# Patient Record
Sex: Male | Born: 1985 | Race: White | Hispanic: No | Smoking: Never smoker
Health system: Southern US, Community
[De-identification: ages and names within clinical notes are randomized; demographics above are authoritative.]

---

## 2021-03-09 ENCOUNTER — Encounter (HOSPITAL_COMMUNITY): Payer: Self-pay | Admitting: *Deleted

## 2021-03-09 ENCOUNTER — Observation Stay (HOSPITAL_COMMUNITY)
Admission: EM | Admit: 2021-03-09 | Discharge: 2021-03-11 | Disposition: A | Discharge status: Home / Self Care | Payer: Self-pay | Attending: Emergency Medicine | Admitting: Emergency Medicine

## 2021-03-09 ENCOUNTER — Other Ambulatory Visit: Payer: Self-pay

## 2021-03-09 DIAGNOSIS — D696 Thrombocytopenia, unspecified: Secondary | ICD-10-CM | POA: Diagnosis not present

## 2021-03-09 DIAGNOSIS — R251 Tremor, unspecified: Secondary | ICD-10-CM | POA: Diagnosis not present

## 2021-03-09 DIAGNOSIS — Y900 Blood alcohol level of less than 20 mg/100 ml: Secondary | ICD-10-CM | POA: Insufficient documentation

## 2021-03-09 DIAGNOSIS — Z20822 Contact with and (suspected) exposure to covid-19: Secondary | ICD-10-CM | POA: Diagnosis not present

## 2021-03-09 DIAGNOSIS — F10131 Alcohol abuse with withdrawal delirium: Secondary | ICD-10-CM | POA: Insufficient documentation

## 2021-03-09 DIAGNOSIS — F10939 Alcohol use, unspecified with withdrawal, unspecified: Secondary | ICD-10-CM | POA: Diagnosis present

## 2021-03-09 DIAGNOSIS — S301XXA Contusion of abdominal wall, initial encounter: Secondary | ICD-10-CM | POA: Diagnosis not present

## 2021-03-09 DIAGNOSIS — E871 Hypo-osmolality and hyponatremia: Secondary | ICD-10-CM

## 2021-03-09 DIAGNOSIS — F10231 Alcohol dependence with withdrawal delirium: Secondary | ICD-10-CM

## 2021-03-09 DIAGNOSIS — S3991XA Unspecified injury of abdomen, initial encounter: Secondary | ICD-10-CM | POA: Diagnosis present

## 2021-03-09 DIAGNOSIS — E876 Hypokalemia: Secondary | ICD-10-CM

## 2021-03-09 DIAGNOSIS — F10931 Alcohol use, unspecified with withdrawal delirium: Secondary | ICD-10-CM

## 2021-03-09 DIAGNOSIS — F10239 Alcohol dependence with withdrawal, unspecified: Secondary | ICD-10-CM | POA: Diagnosis present

## 2021-03-09 LAB — CBC WITH DIFFERENTIAL/PLATELET
Abs Immature Granulocytes: 0.03 10*3/uL (ref 0.00–0.07)
Basophils Absolute: 0 10*3/uL (ref 0.0–0.1)
Basophils Relative: 0 %
Eosinophils Absolute: 0.2 10*3/uL (ref 0.0–0.5)
Eosinophils Relative: 3 %
HCT: 39.4 % (ref 39.0–52.0)
Hemoglobin: 13.1 g/dL (ref 13.0–17.0)
Immature Granulocytes: 1 %
Lymphocytes Relative: 18 %
Lymphs Abs: 1.1 10*3/uL (ref 0.7–4.0)
MCH: 32.8 pg (ref 26.0–34.0)
MCHC: 33.2 g/dL (ref 30.0–36.0)
MCV: 98.7 fL (ref 80.0–100.0)
Monocytes Absolute: 0.7 10*3/uL (ref 0.1–1.0)
Monocytes Relative: 12 %
Neutro Abs: 3.8 10*3/uL (ref 1.7–7.7)
Neutrophils Relative %: 66 %
Platelets: 16 10*3/uL — CL (ref 150–400)
RBC: 3.99 MIL/uL — ABNORMAL LOW (ref 4.22–5.81)
RDW: 12.2 % (ref 11.5–15.5)
WBC: 5.8 10*3/uL (ref 4.0–10.5)
nRBC: 0 % (ref 0.0–0.2)

## 2021-03-09 LAB — URINALYSIS, ROUTINE W REFLEX MICROSCOPIC
Bacteria, UA: NONE SEEN
Bilirubin Urine: NEGATIVE
Glucose, UA: 50 mg/dL — AB
Hgb urine dipstick: NEGATIVE
Ketones, ur: NEGATIVE mg/dL
Leukocytes,Ua: NEGATIVE
Nitrite: NEGATIVE
Protein, ur: 100 mg/dL — AB
Specific Gravity, Urine: 1.018 (ref 1.005–1.030)
pH: 9 — ABNORMAL HIGH (ref 5.0–8.0)

## 2021-03-09 LAB — PROTIME-INR
INR: 1.2 (ref 0.8–1.2)
Prothrombin Time: 15 seconds (ref 11.4–15.2)

## 2021-03-09 LAB — CBG MONITORING, ED
Glucose-Capillary: 150 mg/dL — ABNORMAL HIGH (ref 70–99)
Glucose-Capillary: 151 mg/dL — ABNORMAL HIGH (ref 70–99)

## 2021-03-09 MED ORDER — LORAZEPAM 1 MG PO TABS
1.0000 mg | ORAL_TABLET | Freq: Once | ORAL | Status: AC
  Administered 2021-03-09: 1 mg via ORAL
  Filled 2021-03-09: qty 1

## 2021-03-09 MED ORDER — LORAZEPAM 2 MG/ML IJ SOLN
INTRAMUSCULAR | Status: AC
  Administered 2021-03-09: 2 mg via INTRAVENOUS
  Filled 2021-03-09: qty 1

## 2021-03-09 MED ORDER — LORAZEPAM 1 MG PO TABS: 0.0000 mg | ORAL_TABLET | Freq: Two times a day (BID) | ORAL | Status: DC

## 2021-03-09 MED ORDER — THIAMINE HCL 100 MG/ML IJ SOLN: 100.0000 mg | Freq: Every day | INTRAMUSCULAR | Status: DC

## 2021-03-09 MED ORDER — LORAZEPAM 1 MG PO TABS: 0.0000 mg | ORAL_TABLET | Freq: Four times a day (QID) | ORAL | Status: DC

## 2021-03-09 MED ORDER — THIAMINE HCL 100 MG PO TABS: 100.0000 mg | ORAL_TABLET | Freq: Every day | ORAL | Status: DC

## 2021-03-09 MED ORDER — LORAZEPAM 2 MG/ML IJ SOLN
0.0000 mg | Freq: Four times a day (QID) | INTRAMUSCULAR | Status: DC
  Administered 2021-03-10: 1 mg via INTRAVENOUS
  Filled 2021-03-09: qty 2
  Filled 2021-03-09 (×2): qty 1

## 2021-03-09 MED ORDER — LORAZEPAM 2 MG/ML IJ SOLN
2.0000 mg | Freq: Once | INTRAMUSCULAR | Status: AC
  Administered 2021-03-09: 2 mg via INTRAVENOUS

## 2021-03-09 MED ORDER — LORAZEPAM 2 MG/ML IJ SOLN: 0.0000 mg | Freq: Two times a day (BID) | INTRAMUSCULAR | Status: DC
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
## 2021-03-10 ENCOUNTER — Encounter (HOSPITAL_COMMUNITY): Payer: Self-pay | Admitting: Internal Medicine

## 2021-03-10 ENCOUNTER — Emergency Department (HOSPITAL_COMMUNITY): Payer: Self-pay

## 2021-03-10 DIAGNOSIS — E876 Hypokalemia: Secondary | ICD-10-CM

## 2021-03-10 DIAGNOSIS — D696 Thrombocytopenia, unspecified: Secondary | ICD-10-CM

## 2021-03-10 DIAGNOSIS — F10239 Alcohol dependence with withdrawal, unspecified: Secondary | ICD-10-CM

## 2021-03-10 DIAGNOSIS — E871 Hypo-osmolality and hyponatremia: Secondary | ICD-10-CM

## 2021-03-10 DIAGNOSIS — F10939 Alcohol use, unspecified with withdrawal, unspecified: Secondary | ICD-10-CM | POA: Diagnosis present

## 2021-03-10 LAB — HEPATIC FUNCTION PANEL
ALT: 124 U/L — ABNORMAL HIGH (ref 0–44)
AST: 294 U/L — ABNORMAL HIGH (ref 15–41)
Albumin: 3.2 g/dL — ABNORMAL LOW (ref 3.5–5.0)
Alkaline Phosphatase: 140 U/L — ABNORMAL HIGH (ref 38–126)
Bilirubin, Direct: 0.8 mg/dL — ABNORMAL HIGH (ref 0.0–0.2)
Indirect Bilirubin: 1.6 mg/dL — ABNORMAL HIGH (ref 0.3–0.9)
Total Bilirubin: 2.4 mg/dL — ABNORMAL HIGH (ref 0.3–1.2)
Total Protein: 8.3 g/dL — ABNORMAL HIGH (ref 6.5–8.1)

## 2021-03-10 LAB — CBC WITH DIFFERENTIAL/PLATELET
Abs Immature Granulocytes: 0.02 10*3/uL (ref 0.00–0.07)
Basophils Absolute: 0 10*3/uL (ref 0.0–0.1)
Basophils Relative: 0 %
Eosinophils Absolute: 0 10*3/uL (ref 0.0–0.5)
Eosinophils Relative: 0 %
HCT: 29.4 % — ABNORMAL LOW (ref 39.0–52.0)
Hemoglobin: 9.9 g/dL — ABNORMAL LOW (ref 13.0–17.0)
Immature Granulocytes: 1 %
Lymphocytes Relative: 16 %
Lymphs Abs: 0.5 10*3/uL — ABNORMAL LOW (ref 0.7–4.0)
MCH: 33.3 pg (ref 26.0–34.0)
MCHC: 33.7 g/dL (ref 30.0–36.0)
MCV: 99 fL (ref 80.0–100.0)
Monocytes Absolute: 0.4 10*3/uL (ref 0.1–1.0)
Monocytes Relative: 12 %
Neutro Abs: 2.3 10*3/uL (ref 1.7–7.7)
Neutrophils Relative %: 71 %
Platelets: 13 10*3/uL — CL (ref 150–400)
RBC: 2.97 MIL/uL — ABNORMAL LOW (ref 4.22–5.81)
RDW: 12.6 % (ref 11.5–15.5)
WBC: 3.3 10*3/uL — ABNORMAL LOW (ref 4.0–10.5)
nRBC: 0 % (ref 0.0–0.2)

## 2021-03-10 LAB — COMPREHENSIVE METABOLIC PANEL
ALT: 147 U/L — ABNORMAL HIGH (ref 0–44)
AST: 391 U/L — ABNORMAL HIGH (ref 15–41)
Albumin: 3.6 g/dL (ref 3.5–5.0)
Alkaline Phosphatase: 160 U/L — ABNORMAL HIGH (ref 38–126)
Anion gap: 11 (ref 5–15)
BUN: <5 mg/dL — ABNORMAL LOW (ref 6–20)
CO2: 24 mmol/L (ref 22–32)
Calcium: 8.5 mg/dL — ABNORMAL LOW (ref 8.9–10.3)
Chloride: 95 mmol/L — ABNORMAL LOW (ref 98–111)
Creatinine, Ser: 0.55 mg/dL — ABNORMAL LOW (ref 0.61–1.24)
GFR, Estimated: >60 mL/min (ref >60)
Glucose, Bld: 126 mg/dL — ABNORMAL HIGH (ref 70–99)
Potassium: 3.7 mmol/L (ref 3.5–5.1)
Sodium: 130 mmol/L — ABNORMAL LOW (ref 135–145)
Total Bilirubin: 2.2 mg/dL — ABNORMAL HIGH (ref 0.3–1.2)
Total Protein: 9 g/dL — ABNORMAL HIGH (ref 6.5–8.1)

## 2021-03-10 LAB — ABO/RH: ABO/RH(D): O POS

## 2021-03-10 LAB — TYPE AND SCREEN
ABO/RH(D): O POS
Antibody Screen: NEGATIVE

## 2021-03-10 LAB — RAPID URINE DRUG SCREEN, HOSP PERFORMED
Amphetamines: NOT DETECTED
Barbiturates: NOT DETECTED
Benzodiazepines: NOT DETECTED
Cocaine: NOT DETECTED
Opiates: NOT DETECTED
Tetrahydrocannabinol: NOT DETECTED

## 2021-03-10 LAB — BASIC METABOLIC PANEL
Anion gap: 9 (ref 5–15)
BUN: <5 mg/dL — ABNORMAL LOW (ref 6–20)
CO2: 24 mmol/L (ref 22–32)
Calcium: 8.3 mg/dL — ABNORMAL LOW (ref 8.9–10.3)
Chloride: 98 mmol/L (ref 98–111)
Creatinine, Ser: 0.51 mg/dL — ABNORMAL LOW (ref 0.61–1.24)
GFR, Estimated: >60 mL/min (ref >60)
Glucose, Bld: 101 mg/dL — ABNORMAL HIGH (ref 70–99)
Potassium: 2.8 mmol/L — ABNORMAL LOW (ref 3.5–5.1)
Sodium: 131 mmol/L — ABNORMAL LOW (ref 135–145)

## 2021-03-10 LAB — RESP PANEL BY RT-PCR (FLU A&B, COVID) ARPGX2
Influenza A by PCR: NEGATIVE
Influenza B by PCR: NEGATIVE
SARS Coronavirus 2 by RT PCR: NEGATIVE

## 2021-03-10 LAB — ETHANOL: Alcohol, Ethyl (B): <10 mg/dL (ref <10)

## 2021-03-10 LAB — SALICYLATE LEVEL: Salicylate Lvl: <7 mg/dL — ABNORMAL LOW (ref 7.0–30.0)

## 2021-03-10 LAB — ACETAMINOPHEN LEVEL: Acetaminophen (Tylenol), Serum: <10 ug/mL — ABNORMAL LOW (ref 10–30)

## 2021-03-10 LAB — HIV ANTIBODY (ROUTINE TESTING W REFLEX): HIV Screen 4th Generation wRfx: NONREACTIVE

## 2021-03-10 LAB — MAGNESIUM: Magnesium: 1.9 mg/dL (ref 1.7–2.4)

## 2021-03-10 MED ORDER — FOLIC ACID 1 MG PO TABS
1.0000 mg | ORAL_TABLET | Freq: Every day | ORAL | Status: DC
  Administered 2021-03-10 – 2021-03-11 (×2): 1 mg via ORAL
  Filled 2021-03-10 (×2): qty 1

## 2021-03-10 MED ORDER — LORAZEPAM 2 MG/ML IJ SOLN: 0.0000 mg | Freq: Two times a day (BID) | INTRAMUSCULAR | Status: DC

## 2021-03-10 MED ORDER — THIAMINE HCL 100 MG PO TABS
100.0000 mg | ORAL_TABLET | Freq: Every day | ORAL | Status: DC
  Administered 2021-03-10 – 2021-03-11 (×2): 100 mg via ORAL
  Filled 2021-03-10 (×2): qty 1

## 2021-03-10 MED ORDER — LORAZEPAM 2 MG/ML IJ SOLN: 0.0000 mg | Freq: Four times a day (QID) | INTRAMUSCULAR | Status: DC

## 2021-03-10 MED ORDER — POTASSIUM CHLORIDE CRYS ER 20 MEQ PO TBCR
40.0000 meq | EXTENDED_RELEASE_TABLET | ORAL | Status: AC
  Administered 2021-03-10 (×3): 40 meq via ORAL
  Filled 2021-03-10 (×3): qty 2

## 2021-03-10 MED ORDER — LORAZEPAM 1 MG PO TABS: 1.0000 mg | ORAL_TABLET | ORAL | Status: DC | PRN

## 2021-03-10 MED ORDER — LORAZEPAM 2 MG/ML IJ SOLN: 1.0000 mg | INTRAMUSCULAR | Status: DC | PRN

## 2021-03-10 MED ORDER — SODIUM CHLORIDE 0.9% IV SOLUTION: Freq: Once | INTRAVENOUS | Status: AC

## 2021-03-10 MED ORDER — THIAMINE HCL 100 MG/ML IJ SOLN: 100.0000 mg | Freq: Every day | INTRAMUSCULAR | Status: DC

## 2021-03-10 MED ORDER — IOHEXOL 300 MG/ML  SOLN
100.0000 mL | Freq: Once | INTRAMUSCULAR | Status: AC | PRN
  Administered 2021-03-10: 100 mL via INTRAVENOUS

## 2021-03-10 MED ORDER — ADULT MULTIVITAMIN W/MINERALS CH
1.0000 | ORAL_TABLET | Freq: Every day | ORAL | Status: DC
  Administered 2021-03-10 – 2021-03-11 (×2): 1 via ORAL
  Filled 2021-03-10 (×2): qty 1

## 2021-03-10 MED ORDER — HYDRALAZINE HCL 25 MG PO TABS: 25.0000 mg | ORAL_TABLET | ORAL | Status: DC | PRN

## 2021-03-10 MED ORDER — LABETALOL HCL 5 MG/ML IV SOLN
10.0000 mg | INTRAVENOUS | Status: DC | PRN
  Filled 2021-03-10: qty 4

## 2021-03-10 MED ORDER — MAGNESIUM OXIDE -MG SUPPLEMENT 400 (240 MG) MG PO TABS
800.0000 mg | ORAL_TABLET | Freq: Once | ORAL | Status: AC
  Administered 2021-03-10: 800 mg via ORAL
  Filled 2021-03-10: qty 2
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
## 2021-03-10 NOTE — Discharge Instructions (Signed)
Alcoholics Anonymous [place], [place]  [phone]  [place] [facility] MEETING SCHEDULE Updated 09MAR2020 Check [email or web address] for temporary changes and to download this schedule. District 23 Website: [email or web address] Intergroup & 24-Hour Hotline: [phone]) 032-1437 Intergroup Address: [address] [place], [place] [address] Monday 8:00 am Down & Dirty (D,O,ONL,TC) [address]W of the [address], [address], [place]. Password: [phone] Left corner of "After ONEOK." portico entrance of church. Go to left and find stairs. Meeting is on 2nd Floor, 4th door on left. AA Zoomaholic Discussion Meeting (D,O,ONL,TC) Principal [employer], [address] [place], [place]. Password: [phone] Neale Burly (D,O) [facility], [address] [place], [place]. The meeting space is limited to 25 people. BJ's Wholesale (D,O) [employer], [address] [place], [place]. 10:30 am Daytime (D,O) [facility] [place], [address] [place], [place]. 12:10 pm Karoline Caldwell (C,LIT,ONL) [facility], [address] [place], [place]. 12&12 - Address is 231-1/2 Safeway Inc Meeting ID: [phone] Password: Today Mid-Day Reprieve (LIT,O) [employer], [address] [place], [place]. Lunch Muse (D,O,ONL) [employer], [address] [place], [place]. Meeting ID: [id number] Password: [id number] This is the online component of a hybrid meeting. Lunch Liam Rogers is also meeting in person at the [employer]. 5:30 pm The Shivering Denizens Happy Hour (D,O,ONL) Juanetta Beets, [place], [place]. Meeting ID: [id number] Password: fob Happy Hour (D,O) [employer], [address] [place], [place]. 6:00 pm Pathway to Freedom (D,BE,O) [employer], [address] [place], [place]. Face to Face meeting with seating limited to 25 and mask wearing non-optional. 6:30 pm Sixth Sense (MED,O) [facility], [address] [place]. Meeting in Person - Masks are required 7:00 pm Safe Shelter (D,O) [place] of His  [place], [address] [place], [place]. Big Book No Smoke (B,O) College [address], [address] [place], [place]. Monday (cont) 7:00 pm Serenity (O,SP,X) [facility], [address], [place]. RESTARTING ON [facility]. Mask required, enter church only through the back door at meeting room. No one should go in the church until further notice. Open meeting and Handicap accessible. Open Channel (C,D,MED,TC) St. [address] [place], [address] [place], [place]. Entrance on [address] 7:30 pm Serendipity - In Person and Online (12x12,O,ONL) [facility], [address], Colgate-Palmolive. Meeting Password: [phone] The Way Out - Temporarily Online Only (B,O,X,ONL,TC) First [facility], [address], [place]. ** For the time being this meeting is Online Only ** Masks will be required to attend the meeting in person. Meeting Password: [email or web address] 8:00 pm O'Henry (C,SP,TC) YUM! Brands, [address], [place]. [place] (D,TC,O,ST) Peace [employer] Decker, [address] [place], [place]. Big Book Too (B,C,TC) [address], [address], [place]. Big Book II on Monday at 8 p.m. will remain closed until otherwise noted. 10:00 pm Desperados (D,O) [employer], [address] [place]. Tuesday 8:00 am Down & Dirty (12x12,LIT,O,ONL,TC) [address],8[address] of the [address], [address] [place], [place]. Password: [phone] Left corner of "After ONEOK." portico entrance of church. Go to left and find stairs. Meeting is on 2nd Floor, 4th door on left. AA Zoomaholic Discussion Meeting (D,O,ONL,TC) [employer], [address] [place], [place]. Password: [phone] Neale Burly (D,O) [facility], [address] [place], [place]. The meeting space is limited to 25 people. [address] (12x12,LIT,O) [employer], [address] [place], [place]. 10:30 am Daytime (D,O)  [facility] His [place], [address] [place], [place]. 12:10 pm [address] (B,C,ONL) Era [facility]  [facility], [address] [place], [place]. Address is 231-1/2 Air Products and Chemicals ID: [phone] Password: Today Mid-Day Reprieve (LIT,O) [employer], [address] [place], [place]. Lunch [place] (D,O,ONL) [employer], [address] [place], [place]. Meeting ID: [id number] Password: [id number] This is the online component of a hybrid meeting. Lunch Liam Rogers is also meeting in person at the [employer]. 1:30 pm Group Of Drunks (D,O,TC) [employer], [address], [place]. This meeting is temporarily suspended as the [facility] is closed to outside meetings until further notice due to concerns about COVID-19 5:30 pm The Shivering Denizens Happy Hour (D,O,ONL) Skeet [place], [place]. Meeting ID: [id number] Password: fob Happy Hour (B,O) [employer], [address] [place], [place]. 6:00 pm Pathway to Freedom (12x12,LIT,O) [employer], [address] [place], [place]. Tuesday (cont) 6:30 pm It's In The Book (B,C,W) [address] of His [place], [address] [place], [place]. Meeting is upstairs in the church. Colors of Gratitude (D,O,W,ONL,TC) [facility], [address], [place]. Meeting ID: [id number] [id number] Passcode: Dorien Chihuahua (12x12,B,LIT,O,ONL,TC) [place], [address], [place]. Rule 62 (B,O,ONL) [facility] [address], [address] [place], [place]. Simply AA (D,O) [address] [place]. 7:00 pm Courage to Change (C,D,M) [facility], [address] [place], [place]. Step Lively (C,ST,TR,TC) [place] [facility], [address], [place]. Room 119 8:00 pm Open Door (D,O,ST) Aurther Loft, [address] [place], [place]. Live and Let Live (D,LGBTQ,O,ONL) [facility], [address]  [place], [place]. Meeting ID: [id number] Password: [id number] This is a Orthoptist and in-person meeting. If you choose to show up in-person you are required to wear a mask and there is a limit to 10 in-person participants. Agnostics, Atheists & Freethinkers (D,O,A,ONL,TC) [address],8W of the [address], [address], [place]. This group is not meeting in person. Password: [email or web address] Contact info: [email or web address] website: [email or web address] Left corner of "After [employer] Corporation portico entrance of church. Go to left and find stairs. Meeting is on 2nd Floor, 4th door on left. 10:00 pm Desperados (D,O) [employer], [address], [place]. Wednesday 8:00 am Down & Dirty (D,O,ONL,TC) [address],8W of the [address], [address] [place], [place]. Password: [phone] Left corner of "After ONEOK." portico entrance of church. Go to left and find stairs. Meeting is on 2nd Floor, 4th door on left. AA Zoomaholic Discussion Meeting (D,O,ONL,TC) [employer], [address] [place], [place]. Password: [phone] Neale Burly (D,O) [facility], [address] [place] [place], [place]. The meeting space is limited to 25 people. BJ's Wholesale (D,O) [employer], [address] [place], [place]. 10:30 am Daytime (D,O) [facility] His [place], [address] [place], [place]. 12:10 pm Karoline Caldwell (B,C,ONL) [facility], [address] [place], [place]. Address is 231-1/2 Air Products and [employer]s ID: [phone] Password: Today Mid-Day Reprieve (LIT,O) [employer], [address] [place], [place]. Lunch [place] (D,O,ONL) [employer], [address] [place], [place]. Meeting ID: [id number] Password: [id number] This is the online component of a hybrid meeting. Lunch Liam Rogers is also meeting in person at the [employer]. 5:30 pm The Shivering Denizens Happy Hour (D,O,ONL) Juanetta Beets, [place], [place]. Meeting ID: [id number] Password: fob Happy Hour (B,O) [employer], [address] [place], [place].  1 / 3 Wednesday (cont) 6:00 pm Pathway to Freedom (B,LIT,O) [employer], [address] [place], [place]. Face to Face meeting with seating limited to 25 and  mask wearing non-optional. 6:15 pm Gen. Svc. Study (Service Nerds) (C,XT,D,LIT,TR,X,ONL,TC) [facility], [address], [place]. Meeting ID: [id number] Meeting Password: [phone] Room 202 This meeting is focused on studying general service material, including: -The A.A. Service Manual - NameSecurities.com.cyhttps://www.aa.org/assets/en_US/en_bm-31.pdf -Twelve Traditions -Twelve Concepts for World Service -Pamphlets & Guidelines -Alcoholics Anonymous Comes of Age -Other service material We're all about active conversation and lively discussion. 6:30 pm Nighttime on Neva SeatGreene (C,LIT,TC) [facility], [address], [place]. Address is 231-1/2 N [address] Sixth Sense (MED,O) St. [facility], [address], [place]. Meeting in Person - Masks Required 7:00 pm Easy Does It (D,O,ASL) R[address] Airport BlvdUnited Methodist Church, [address], [place]. PLEASE TAKE NOTE: The Easy Does It Home Group will not be meeting at its regular location on Wednesday, June, 23rd due to a conflicting event at our normal meeting space. Instead, we will be meeting outdoors at the [facility]. Our meeting will meet in the gazebo just inside the entrance on [address] [phone]([address], [place], [place]). There is free parking just across the street from the entrance to the park. We look forward to seeing you! All are welcome, but please be conscious of others and be mindful. We are strongly urging attendees to wear masks and gloves. If you are showing symptoms or have a fever we lovingly ask you to try an online meeting if you need a meeting or call someone in your network or intergroup for help. There will be no coffee or snacks served so if you want coffee it is BYOC. We have access to a large  room where we can put distance between chairs and we will not be holding hands or passing 7th tradition. All surfaces will be sanitized prior to the meeting. If attendance is more than 20-25 people, we will breaking out into separate meetings using additional separate rooms. If you arrive at the church and the door is locked, text [phone]. Much love and stay safe, we look forward to seeing you again! Deaf and Hard of Hearing Accessible - ASL Interpretation Enter via the entrance on [address] and take the elevator to the 4th floor. 8:00 pm [address] (O,SP) [place] Black & DeckerUnited Methodist Church, [address], [place]. Our website [email or web address] currently has 23 AA talks from past meetings dating back to 2015. We'll be uploading more from the archives this week as well. [place] (LIT,O) [facility], [address], [place]. mask no longer required and we have plenty of coffee. AA Zoomaholic Speaker Meeting (SP,ONL) [place], [place], [place]. Password: [phone] Robet LeuYoung People (D,BE,O,Y) [employer] CorporationUnity Club, [address], [place]. Re-opening, face masks are strongly encouraged. Mustard Seed (C,D) [facility], [address], [place]. 10:00 pm Desperados (D,O) [employer] CorporationUnity Club, [address], [place]. Thursday (cont) 8:00 am Down & Dirty (D,O,ONL,TC) [address],8WPresbyterian Church of the [address] Northovenant, [address], [place]. Password: [phone] Left corner of "After ONEOKateway Inc." portico entrance of church. Go to left and find stairs. Meeting is on 2nd Floor, 4th door on left. AA Zoomaholic Discussion Meeting (D,O,ONL,TC) [facility] FinancialSummit [facility], [address], [place]. Password: [phone] Neale BurlyEarly Bird (D,O) [facility], [address], [place]. The meeting space is limited to 25 people. 9115 Rose DriveDawn Patrol (B,LIT,O) [employer] CorporationUnity Club, [address], [place]. 10:30 am Daytime Men's (D,M,O)  [address] Parkwayathedral of His EvelynshireGlory, [address], [place]. Daytime Women's (D,O,W) Cathedral of His WampumGlory, [address], [place]. 12:10  pm Karoline Caldwell (C,D,ONL) [facility], [address] [place], [place]. Address is 231-1/2 [employer] ID: [phone] Password: Today Mid-Day Reprieve (LIT,O) [employer], [address] [place], [place]. Lunch [place] (D,O,ONL) [employer], [address] [place], [place]. Meeting ID: [id number] Password: [id number] This is the online component of a hybrid meeting. Lunch Liam Rogers is also meeting in person at the [employer]. 5:30 pm The Shivering Denizens Happy Hour (D,O,ONL) Juanetta Beets, [place], [place]. Meeting ID: [id number] Password: fob Happy Hour (D,O) [employer], [address] [place], [place]. 6:00 pm Pathway to Freedom (ABSI,LIT,O) [employer], [address] [place], [place]. 6:30 pm Pura Spice (D,O,ONL,TC) [facility], [address] [place], [place]. Meeting Password: [phone] Simply AA (B,O) [address] [place], [address] [place]. 7:00 pm Safe [place] (B,O) [address] [place], [address], [place]. Safe Shelter (D,O) Cathedral of His [place], [address] [place], [place]. 7:30 pm Cornerstone Group (B,O) Cornerstone [address], [address] [place], [place]. In the modular building to the left of main church. Serendipity (LIT,O,ONL,TC) [facility], [address], Colgate-Palmolive. Meeting Password: [phone] Harrell Gave (D,O,TC) St. [facility], [address], [place]in[place]. The meeting is in the small brick house called St. Mike's. It is located on the left as you come up the church's driveway. There is a sign. Muirs Express Scripts (C,D,M,OUT,ONL) [employer], [address] [place], [place]. Meeting Number: [id number] Password: [email or web address] Meeting in-person weather-permitting on the patio outside the entrance. Bring your own  chair. 8:00 pm Hope for Alcoholics (O,SP,ONL) [place], [place], [place]. Meeting ID: [id number] Password: [phone] Dogwood (O,SP,TC) Proximity [address], [address] [place], [place]. Thursday (cont) 8:00 pm Masco Corporation (LIT,O) [employer], [address], [place]. As Weyerhaeuser Company It 10:00 pm Dillard's (D,O) [employer], [address] [place]. Friday 8:00 am Down & Dirty (D,O,ONL,TC) [address],8W of the [address], [address], [place]. Password: [phone] Left corner of "After ONEOK." portico entrance of church. Go to left and find stairs. Meeting is on 2nd Floor, 4th door on left. AA Zoomaholic Discussion Meeting (D,O,ONL,TC) [employer], [address] [place], [place]. Password [phone] Neale Burly (D,O) [facility], [address] [place], [place]. The meeting space is limited to 25 people. BJ's Wholesale (D,O) [employer], [address] [place], [place]. 10:30 am Daytime (D,O) Cathedral of His [place], [address] [place], [place]. 12:10 pm Karoline Caldwell (C,LIT,ONL) [facility], [address] [place], [place]. As Vernie Shanks It - Address is 231-1/2 [employer] ID: [phone] Password: Today Mid-Day Reprieve (LIT,O) [employer], [address] [place], [place]. Lunch [place] (D,O,ONL) [employer], [address] [place], [place]. Meeting ID: [id number] Password: [id number] This is the online component of a hybrid meeting. Lunch Liam Rogers is also meeting in person at the [employer]. 5:30 pm The Shivering Denizens Happy Hour (D,O,ONL) Juanetta Beets, [place], [place]. Meeting ID: [id number] Password: fob Happy Hour (LIT,O) [employer], [address] [place], [employer]. As Weyerhaeuser Company It 6:00 pm Agnostics, Atheists, Freethinkers (D,O,A,ONL) [address],8W of the [address], [address], [place]. This group is meeting in person and on Zoom. Contact the group for info on connecting to their online meeting.  Masks and social distancing are required for in-person meeting (per the church) and attendance is limited to 15; and ABSOLUTELY NO SMOKING ON CHURCH PROPERTY (per the church). Left corner of "After ONEOK." portico entrance of church. Go to left and find stairs. Meeting is  on 2nd Floor, 4th door on left. Pathway to Freedom (D,O) [employer], [address] [place], [place]. [place] Magnolia Group (12x12,LIT,O,TC) The The [employer] at [employer], [address] [place], [place]. Please see the link below for a map to the meeting building. Women's Experience, Strength & Hope (LIT,O,W,TC) [facility], [address], [place]. Room 100 - Study of Spiritual Awakenings and Emotional Sobriety I & II 6:30 pm Rule 62 (O,ST,ONL) Muir's [address], [address] Mu[address] [place], [place].  2 / 3 Friday (cont) 7:00 pm Into Action (LIT) Ryland Group [address], [address], [place]. Guided literature discussion. Enter the Building from So[employer]. The church has asked that attendees wear masks unless socially distanced. If you are showing symptoms we lovingly ask you to try an online meeting or call someone in your network or intergroup for help. The meeting rooms allow participants to be socially distanced. Enter via the entrance on [employer] and take the elevator to the 4th floor. 8:00 pm Safe Shelter - Notus Group (D,O,SP) Cathedral of His [place], [address] [place], [place]. Circuit City (B,O,SP,Y) [employer], [address] [place], [place]. Facemasks are required. Speaker meeting last Friday of the month 10:00 pm Desperados (D,O) [employer], [address], [place]. Saturday 8:00 am Serendipity (B,O,ONL,TC) [facility], [address], Colgate-[employer]mol[employer]. Meeting Password: [phone] AA Zoomaholic Discussion Meeting (D,O,ONL) Skeet Simmer, [place]. Password  [phone] Down & Dirty (D,O,ONL,TC) [address],8W of the [address], [address] [place], [place]. Password: [phone] Left corner of "After ONEOK." portico entrance of church. Go to left and find stairs. Meeting is on 2nd Floor, 4th door on left. Neale Burly (D,O) [employer], [address] [place], [place]. The meeting space is limited to 25 people. BJ's Wholesale (D,O) [employer], [address] [place], [place]. 9:30 am Saturday Morning Men's Meeting (M,ONL) [place], [place], [place]. Meeting ID: [id number] Password: [id number] Saturday Morning Men's Group (Men Only) (C,D,M) [employer], [address] [place], [place]. Large meeting in back room. Small meeting in front room. 10:30 am It's In The Book (12x12,B,C,W) [address] of His Evelyn[place], [address] [place], [place]. Meeting is upstairs in the church. 11:00 am Ladene Artist - You Are Not Alone - Women's Group (LIT,TC,O,W) [facility], [address] [place], [place]. Keeping It Green: [employer] Energy Corporation (D,O,W) [employer], [address] [place], [place]. 12:10 pm Karoline Caldwell (C,LIT) [facility], [address] [place], [place]. Address is 231-1/2 N [address] (D,MED,O,ONL) [employer], [address], [place]. Meeting ID: [id number] Password: [id number] This is the online component of a hybrid meeting. Lunch Liam Rogers is also meeting in person at the [employer]. 12:30 pm Keeping It Green: [employer] (D,O,W,ONL,TC) [employer], [address] [place], [place]. Meeting ID: [id number] Password: [id number] 5:30 pm Happy Hour (D,O) [employer], [address] [place]. 6:00 pm Pathway to Freedom (O,SP) [employer], [address] [place], [place]. Face to Face meeting with seating limited to 25 and mask wearing non-optional. 6:30 pm Rule 62 (D,O,ONL) [facility] [address], [address] Mu[address] [place], [place]. Saturday (cont) 7:00 pm Free [address]  (LIT,LGBTQ,O,ONL) St. [facility], [address] [place], [place]. Entrance on [address] Fellowship at [facility] at Chubb Corporation Coffee [address] [place], [place], [place] [address]. 8:00 pm Pepsi (O,SP) Proximity [address], [address] [place], [place]. Starmount (O,SP,X) [facility] [place], [address] [place]. 10:00 pm Circuit City (CAN,O,ST,Y) [employer], [address] [place], [place]. Facemasks are encouraged. Sunday 8:00 am AA Zoomaholic Discussion Meeting (D,O,ONL) Skeet [place], [place]. Password [phone] Down & Dirty (D,O,ONL,TC) [address],8W of the [address], [address] [place], [place]. Password: [phone] Left corner of "After ONEOK." portico entrance of church. Go to left and find stairs. Meeting is on 2nd Floor, 4th door on left. Neale Burly (D,O) [employer], [address] [place], [place]. The meeting space is limited to 25 people. [address] (ABSI,LIT,O) [employer], [address] [place], [place]. 10:00 am Hospital (LIT,O,ONL) Gymnasium, [address], [place]. Meeting ID: [id number] Meeting Password: [phone] This meeting is a hybrid in-person and online meeting. We are meeting at the gymnasium behind the church at the listed address. 12:10 pm Karoline Caldwell (C,LIT) [facility], [address] [place], [place]. Address is 231-1/2 [address] (D,O,ONL) [employer], [address] [place]. Meeting ID: [id number] Password: [id number] This is the online component of a hybrid meeting. Lunch Liam Rogers is also meeting in person at the [employer]. 5:30 pm Happy Hour (O,SP) [employer], [address] [place], [place]. 6:00 pm Pathway to Freedom (LIT,O) [employer], [address] [place], [place]. The Sunday meeting features Stories of Hope! 6:30 pm Turning Point (D,O) [facility], [address] [place]. 7:30 pm Happy [place] (B,O) [employer], [address] [place], [place]. 8:00 pm Radiance (O,SP,ONL) Congregational [facility] [facility] [place], [address], [place]inette [place]. Meeting Password: [phone] This meeting is online/call-in only for the foreseeable future. Language of the Heart (D,O,SP,TC) First [address], [address] [place], [place]. 10:00 pm Desperados (D,O) [employer], [address] [place]. Meeting Codes 12x12 = 12 Steps and 12 Traditions BA = Babysitting Available B = Big Book Study CAN = Candlelight C = Closed (Those with a desire to stop drinking) XT = Cross Talk Permitted D = Discussion GR = [place] LIT = Literature LGBTQ = LGBTQ MED = Meditation M = Men's Meeting BE = Newcomer O = Open (Open to general public) A = Secular S = Spanish SP = Speaker Meeting ST = Step Meeting TR = Tradition Study X = Wheelchair Access W = Women's Meeting Y = Young People Powered by [employer] ([email or web address])  3 /

## 2021-03-10 NOTE — ED Notes (Signed)
Patient transported to CT 
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
## 2021-03-10 NOTE — ED Notes (Signed)
Pt resting at this time. VS WNL. Visible chest rise and fall noted. Pt appears in NAD.  

## 2021-03-10 NOTE — ED Notes (Signed)
Pt eating dinner at this time

## 2021-03-10 NOTE — ED Notes (Signed)
Pt given water and eating parts of lunch tray at this time. Pt denies N/V, pain at this time.

## 2021-03-10 NOTE — Assessment & Plan Note (Addendum)
Repleted. °

## 2021-03-10 NOTE — ED Notes (Addendum)
After collecting covid test pt began to yell and seize. Pt was place in supine position and turned on his side. Suctioned, oygen masked and seizure pads placed.

## 2021-03-10 NOTE — Assessment & Plan Note (Addendum)
-   already having tremors in the ER; did not develop any further worsening of withdrawal - drinks ~12 pack Budweiser daily -Alcohol cessation recommended at discharge

## 2021-03-10 NOTE — Assessment & Plan Note (Addendum)
-   Differential includes severe alcohol abuse vs nutrient deficiency vs ITP - given mouth bleeding and downtrending PLTC, give 1 pack platelets 7/25 - PLTC improved to 41k at discharge; no overt bleeding; stable bruise size on abdomen

## 2021-03-10 NOTE — Social Work (Signed)
CSW working remotely spoke to Pt via telephone in Pt room. CAGE AID score 2. Pt reports that he has not had a drink since Saturday and that he would like to continue to remain abstinent.  CSW provided counseling on various methods for stopping drinking and Pt requested that information be added to AVS.

## 2021-03-11 DIAGNOSIS — F10230 Alcohol dependence with withdrawal, uncomplicated: Secondary | ICD-10-CM

## 2021-03-11 LAB — PREPARE PLATELET PHERESIS: Unit division: 0

## 2021-03-11 LAB — BPAM PLATELET PHERESIS
Blood Product Expiration Date: 202207262359
ISSUE DATE / TIME: 202207250911
Unit Type and Rh: 9500

## 2021-03-11 LAB — CBC WITH DIFFERENTIAL/PLATELET
Abs Immature Granulocytes: 0.02 10*3/uL (ref 0.00–0.07)
Basophils Absolute: 0 10*3/uL (ref 0.0–0.1)
Basophils Relative: 0 %
Eosinophils Absolute: 0 10*3/uL (ref 0.0–0.5)
Eosinophils Relative: 1 %
HCT: 39.8 % (ref 39.0–52.0)
Hemoglobin: 13 g/dL (ref 13.0–17.0)
Immature Granulocytes: 0 %
Lymphocytes Relative: 23 %
Lymphs Abs: 1.1 10*3/uL (ref 0.7–4.0)
MCH: 32.7 pg (ref 26.0–34.0)
MCHC: 32.7 g/dL (ref 30.0–36.0)
MCV: 100 fL (ref 80.0–100.0)
Monocytes Absolute: 0.5 10*3/uL (ref 0.1–1.0)
Monocytes Relative: 11 %
Neutro Abs: 3.2 10*3/uL (ref 1.7–7.7)
Neutrophils Relative %: 65 %
Platelets: 41 10*3/uL — ABNORMAL LOW (ref 150–400)
RBC: 3.98 MIL/uL — ABNORMAL LOW (ref 4.22–5.81)
RDW: 12.3 % (ref 11.5–15.5)
WBC: 4.9 10*3/uL (ref 4.0–10.5)
nRBC: 0 % (ref 0.0–0.2)

## 2021-03-11 LAB — COMPREHENSIVE METABOLIC PANEL
ALT: 125 U/L — ABNORMAL HIGH (ref 0–44)
AST: 267 U/L — ABNORMAL HIGH (ref 15–41)
Albumin: 3.4 g/dL — ABNORMAL LOW (ref 3.5–5.0)
Alkaline Phosphatase: 132 U/L — ABNORMAL HIGH (ref 38–126)
Anion gap: 8 (ref 5–15)
BUN: 7 mg/dL (ref 6–20)
CO2: 23 mmol/L (ref 22–32)
Calcium: 8.9 mg/dL (ref 8.9–10.3)
Chloride: 104 mmol/L (ref 98–111)
Creatinine, Ser: 0.54 mg/dL — ABNORMAL LOW (ref 0.61–1.24)
GFR, Estimated: >60 mL/min (ref >60)
Glucose, Bld: 93 mg/dL (ref 70–99)
Potassium: 3.7 mmol/L (ref 3.5–5.1)
Sodium: 135 mmol/L (ref 135–145)
Total Bilirubin: 2.6 mg/dL — ABNORMAL HIGH (ref 0.3–1.2)
Total Protein: 8.7 g/dL — ABNORMAL HIGH (ref 6.5–8.1)

## 2021-03-11 LAB — MAGNESIUM: Magnesium: 2.2 mg/dL (ref 1.7–2.4)

## 2021-03-11 LAB — PHOSPHORUS: Phosphorus: 3.8 mg/dL (ref 2.5–4.6)

## 2021-03-11 NOTE — TOC Transition Note (Signed)
Transition of Care [facility]) - CM/SW Discharge Note   Patient Details  Name: Howard Greene MRN: [id number] Date of Birth: [birth year]  [facility] CM/SW Contact:  Pollie Friar, RN Phone Number: 03/11/2021, 11:34 AM   Clinical Narrative:    CM met with the patient as he is listed as homeless. Pt states he has a place to go at d/c. CM inquired about transportation today and he states he has a friend that will provide transport. CIWA done per previous CSW with information on the AVS. CM inquired about helping patient obtain a PCP and he declined.  No further needs per TOC.   Final next level of care: Other (comment) Barriers to Discharge: Inadequate or no insurance, Barriers Unresolved (comment)   Patient Goals and CMS Choice        Discharge Placement                       Discharge Plan and Services                                     Social Determinants of Health (SDOH) Interventions     Readmission Risk Interventions No flowsheet data found.

## 2021-03-11 NOTE — Progress Notes (Signed)
Patient was discharged from 5W08. IV's d/c'd. VS stable. AVS reviewed and all questions answered. Patient has no complaints of pain. All belongings taken with patient. Patient was walked to his vehicle at the ED.

## 2021-03-11 NOTE — Progress Notes (Addendum)
Pt accepted to room 8 from ED.  Pt ambulated w/ standby to the bathroom. Does not report any pain. Familiar w/ the call bell use which is near him. VS taken, CIWA scored. Pt PIV patent.  Oriented to room.  Given CHG bath, skin checked w/ Susy Frizzle, RN. Tele portable unit set up and verified w/ CCMD along with continuous pulse ox.   Urinal placed at bedside and pt. Given water and soda based on regular diet order. Pt would like breakfast menu in spanish.  Primarily spanish speaking.
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
## 2021-03-11 NOTE — ED Notes (Signed)
Pt reports he wants to leave today. He feels fine. And needs to get to the hotel to get his belongings. MD notified.
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
